# Patient Record
Sex: Female | Born: 1990 | Hispanic: Yes | Smoking: Never smoker
Health system: Southern US, Community
[De-identification: ages and names within clinical notes are randomized; demographics above are authoritative.]

---

## 2020-07-28 ENCOUNTER — Emergency Department
Admission: EM | Admit: 2020-07-28 | Discharge: 2020-07-28 | Disposition: A | Discharge status: Home / Self Care | Payer: Self-pay | Attending: Emergency Medicine | Admitting: Emergency Medicine

## 2020-07-28 ENCOUNTER — Emergency Department: Payer: Self-pay

## 2020-07-28 ENCOUNTER — Other Ambulatory Visit: Payer: Self-pay

## 2020-07-28 ENCOUNTER — Encounter: Payer: Self-pay | Admitting: *Deleted

## 2020-07-28 DIAGNOSIS — M722 Plantar fascial fibromatosis: Secondary | ICD-10-CM | POA: Insufficient documentation

## 2020-07-28 MED ORDER — MELOXICAM 15 MG PO TABS: 15.0000 mg | ORAL_TABLET | Freq: Every day | ORAL | 0 refills | Status: AC

## 2020-07-28 NOTE — ED Provider Notes (Signed)
There is no  [facility] Emergency Department Provider Note   ____________________________________________   Event Date/Time   First MD Initiated Contact with Patient 07/28/20 1519     (approximate)  I have reviewed the triage vital signs and the nursing notes.   HISTORY  Chief Complaint Foot Pain   HPI Kayla Hawkins is a 30 y.o. female presents to the ED with complaint of bilateral foot pain with the left foot being worse than the right.  Patient states this been going on for several weeks and that there was no history of injury.  Patient has been taking Tylenol daily without any relief.  She is also switched shoes which has not helped.  Patient stands most of her working day.  She denies any pain when getting up in the mornings.  She rates her pain as 9 out of 10.       No past medical history on file.  There are no problems to display for this patient.   Prior to Admission medications   Medication Sig Start Date End Date Taking? Authorizing Provider  meloxicam (MOBIC) 15 MG tablet Take 1 tablet (15 mg total) by mouth daily. 07/28/20 07/28/21 Yes Tommi Rumps, PA-C    Allergies Patient has no known allergies.  No family history on file.  Social History Social History   Tobacco Use  . Smoking status: Never Smoker  . Smokeless tobacco: Never Used  Substance Use Topics  . Alcohol use: Not Currently  . Drug use: Not Currently    Review of Systems Constitutional: No fever/chills Eyes: No visual changes. ENT: No sore throat. Cardiovascular: Denies chest pain. Respiratory: Denies shortness of breath. Gastrointestinal: No abdominal pain.  No nausea, no vomiting.  No diarrhea.  Musculoskeletal: Bilateral foot pain Skin: Negative for rash. Neurological: Negative for headaches, focal weakness or numbness.  ____________________________________________   PHYSICAL EXAM:  VITAL SIGNS: ED Triage Vitals  Enc Vitals Group     BP  07/28/20 1512 (!) 112/51     Pulse Rate 07/28/20 1512 90     Resp 07/28/20 1512 18     Temp 07/28/20 1512 (!) 97.5 F (36.4 C)     Temp Source 07/28/20 1512 Oral     SpO2 07/28/20 1512 97 %     Weight 07/28/20 1513 130 lb (59 kg)     Height 07/28/20 1513 4\' 7"  (1.397 m)     Head Circumference --      Peak Flow --      Pain Score 07/28/20 1513 9     Pain Loc --      Pain Edu? --      Excl. in [place]? --     Constitutional: Alert and oriented. Well appearing and in no acute distress. Eyes: Conjunctivae are normal.  Head: Atraumatic. Neck: No stridor.   Cardiovascular: Normal rate, regular rhythm. Grossly normal heart sounds.  Good peripheral circulation. Respiratory: Normal respiratory effort.  No retractions. Lungs CTAB. Musculoskeletal: On examination bilateral gross deformity however there is some minimal soft tissue tenderness noted on the plantar aspect medial area on palpation.  Skin is intact and no discoloration is noted.  Pulses are present.  Motor sensory function intact.  Left foot is more tender than the right. Neurologic:  Normal speech and language. No gross focal neurologic deficits are appreciated. No gait instability. Skin:  Skin is warm, dry and intact. No rash noted. Psychiatric: Mood and affect are normal. Speech and behavior are normal.  ____________________________________________   LABS (all labs ordered are listed, but only abnormal results are displayed)  Labs Reviewed - No data to display ____________________________________________  RADIOLOGY I, Tommi Rumps, personally viewed and evaluated these images (plain radiographs) as part of my medical decision making, as well as reviewing the written report by the radiologist.  Official radiology report(s): DG Foot Complete Left  Result Date: 07/28/2020 CLINICAL DATA:  Pain EXAM: LEFT FOOT - COMPLETE 3+ VIEW COMPARISON:  None. FINDINGS: Frontal, oblique, and lateral views were obtained. There is no fracture  or dislocation. Joint spaces appear normal. No erosive change. IMPRESSION: No fracture or dislocation.  No evident arthropathy. Electronically Signed   By: Bretta Bang III M.D.   On: 07/28/2020 16:09    ____________________________________________   PROCEDURES  Procedure(s) performed (including Critical Care):  Procedures   ____________________________________________   INITIAL IMPRESSION / ASSESSMENT AND PLAN / ED COURSE  As part of my medical decision making, I reviewed the following data within the electronic MEDICAL RECORD NUMBER Notes from prior ED visits and [place] Controlled Substance Database  31 year old female presents to the ED with complaint of bilateral  foot pain without known injury.  Physical exam shows tenderness on palpation of the medial aspect of the plantar portion of the left foot greater than the right.  No other physical findings.  X-rays were negative.  Patient was made aware that this could be plantar fasciitis and she agrees to wear supportive shoes with possible orthotics.  She will start meloxicam 1 daily with food and she is aware that she may take Tylenol if needed for additional pain control.  If not improving she is to follow-up with Dr. Ether Griffins who is on-call for podiatry at no clinic. ____________________________________________   FINAL CLINICAL IMPRESSION(S) / ED DIAGNOSES  Final diagnoses:  Plantar fasciitis, bilateral     ED Discharge Orders         Ordered    meloxicam (MOBIC) 15 MG tablet  Daily        07/28/20 1635          *Please note:  Kayla Hawkins was evaluated in Emergency Department on 07/28/2020 for the symptoms described in the history of present illness. She was evaluated in the context of the global COVID-19 pandemic, which necessitated consideration that the patient might be at risk for infection with the SARS-CoV-2 virus that causes COVID-19. Institutional protocols and algorithms that pertain to the evaluation of patients  at risk for COVID-19 are in a state of rapid change based on information released by regulatory bodies including the CDC and federal and state organizations. These policies and algorithms were followed during the patient's care in the ED.  Some ED evaluations and interventions may be delayed as a result of limited staffing during and the pandemic.*   Note:  This document was prepared using Dragon voice recognition software and may include unintentional dictation errors.    Tommi Rumps, PA-C 07/28/20 1641    Gilles Chiquito, MD 07/28/20 [phone]

## 2020-07-28 NOTE — Discharge Instructions (Signed)
Follow-up with your family or if any continued problems.  Ice to the arch of your foot at night as we discussed.  Begin taking the meloxicam 1 daily with food for inflammation and to help with pain.  You may also continue taking Tylenol with this medication if needed.  Continue to wear shoes with arch support.

## 2020-07-28 NOTE — ED Triage Notes (Signed)
Pt ambulatory to triage.  Pt has bil foot pain   No known injury   Sx for several weeks, pain worse yesterday.  Pt alert speech clear.

## 2021-09-20 IMAGING — DX DG FOOT COMPLETE 3+V*L*
3 series · 3 of 3 positions shown · non-contrast
Comparison: None.

CLINICAL DATA: Pain

EXAM:
LEFT FOOT - COMPLETE 3+ VIEW

[foot ap]
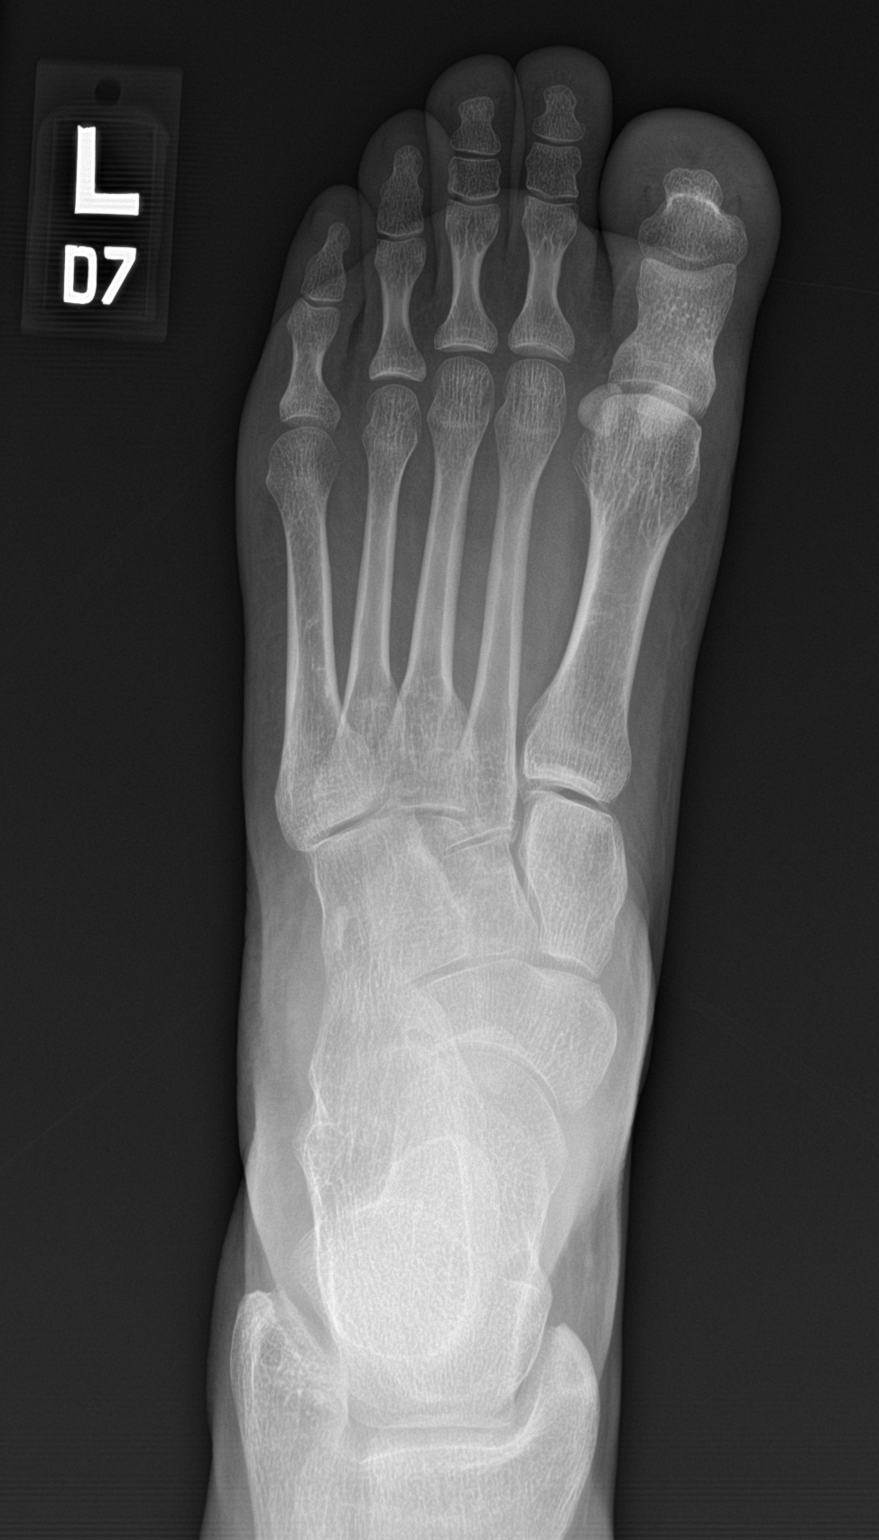

[foot obl]
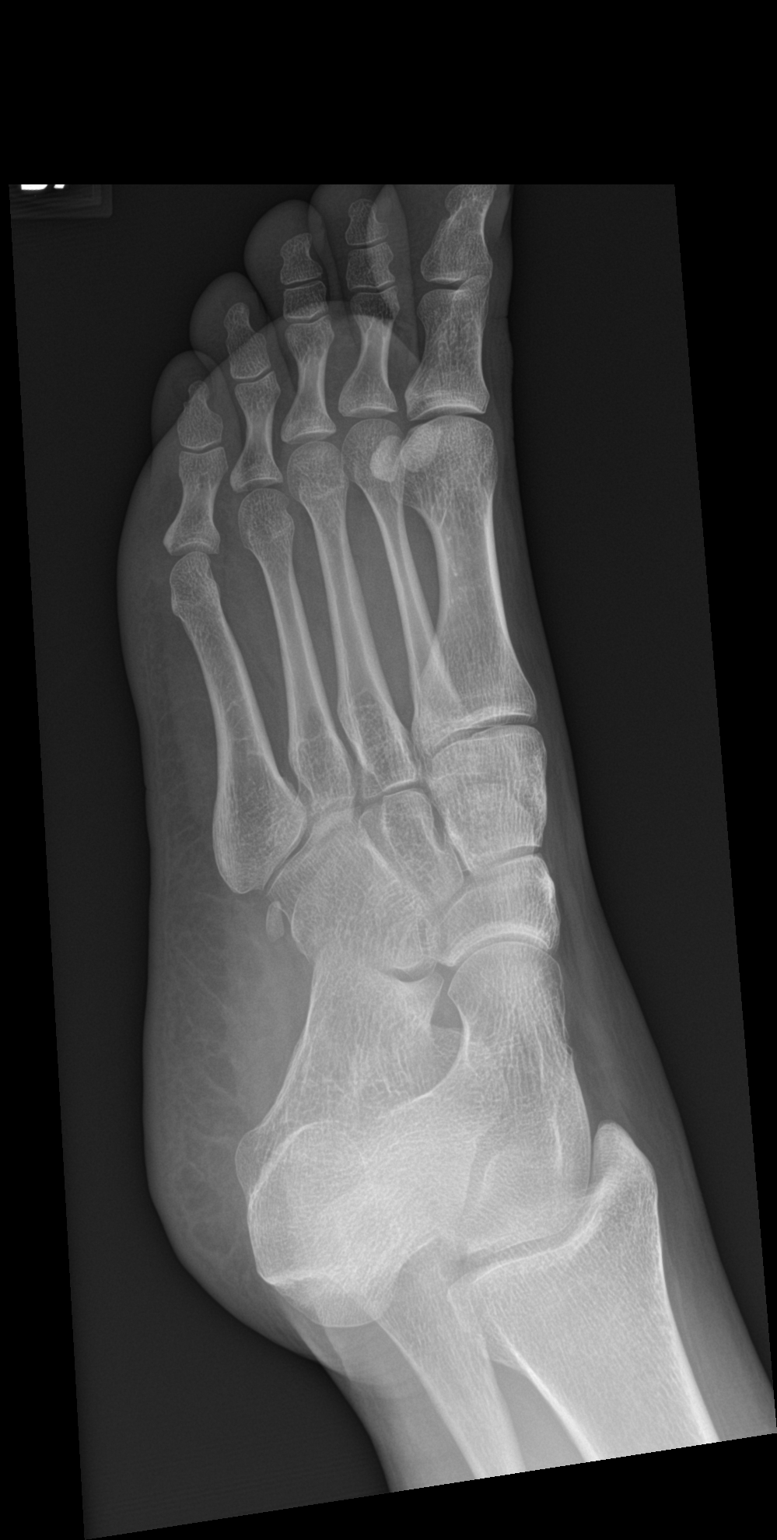

[foot lat]
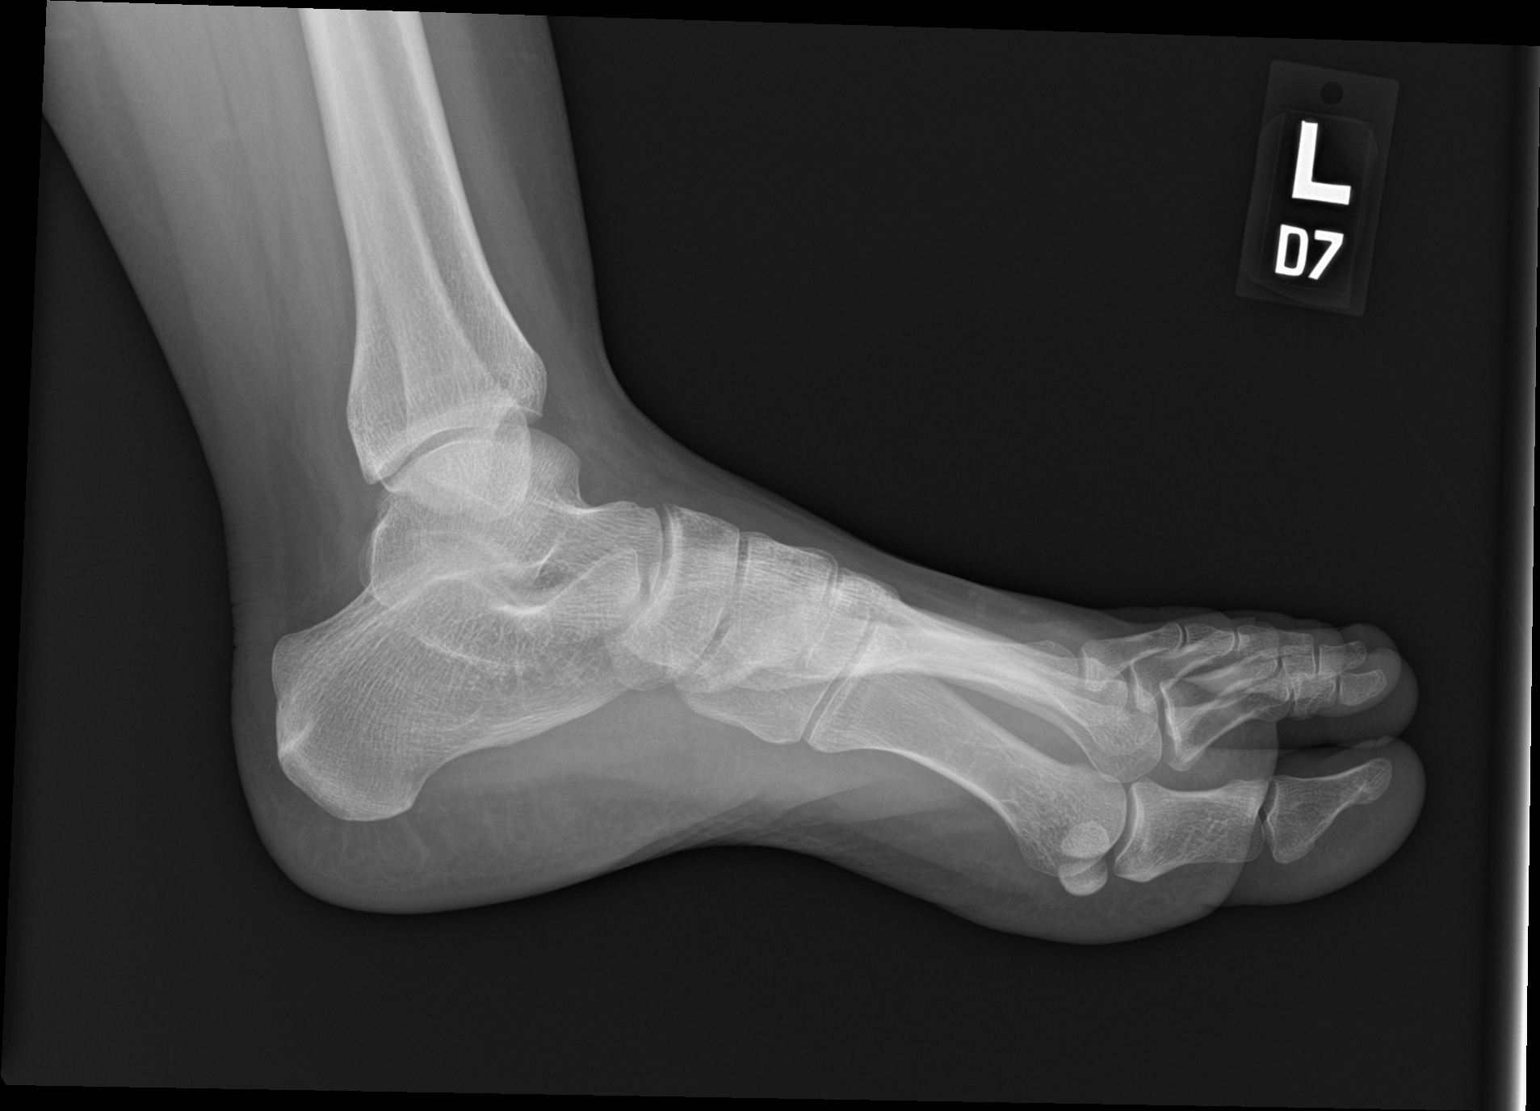

[3 of 3 positions shown; findings below may reference images not displayed]

FINDINGS: Frontal, oblique, and lateral views were obtained. There is no
fracture or dislocation. Joint spaces appear normal. No erosive
change.
IMPRESSION: No fracture or dislocation.  No evident arthropathy.

## 2022-10-10 ENCOUNTER — Other Ambulatory Visit: Payer: Self-pay

## 2022-10-10 ENCOUNTER — Emergency Department
Admission: EM | Admit: 2022-10-10 | Discharge: 2022-10-10 | Disposition: A | Discharge status: Home / Self Care | Payer: Self-pay | Attending: Emergency Medicine | Admitting: Emergency Medicine

## 2022-10-10 ENCOUNTER — Encounter: Payer: Self-pay | Admitting: *Deleted

## 2022-10-10 DIAGNOSIS — H66002 Acute suppurative otitis media without spontaneous rupture of ear drum, left ear: Secondary | ICD-10-CM | POA: Insufficient documentation

## 2022-10-10 MED ORDER — AMOXICILLIN-POT CLAVULANATE 875-125 MG PO TABS
1.0000 | ORAL_TABLET | Freq: Once | ORAL | Status: AC
  Administered 2022-10-10: 1 via ORAL
  Filled 2022-10-10: qty 1

## 2022-10-10 MED ORDER — IBUPROFEN 400 MG PO TABS
400.0000 mg | ORAL_TABLET | Freq: Once | ORAL | Status: AC
  Administered 2022-10-10: 400 mg via ORAL
  Filled 2022-10-10: qty 1

## 2022-10-10 MED ORDER — AMOXICILLIN-POT CLAVULANATE 875-125 MG PO TABS: 1.0000 | ORAL_TABLET | Freq: Two times a day (BID) | ORAL | 0 refills | Status: AC

## 2022-10-10 NOTE — ED Triage Notes (Signed)
Pt has left earache since this am.  No cough.  Pt reports a runny nose  pt alert.

## 2022-10-10 NOTE — ED Provider Notes (Signed)
   [facility] Provider Note    Event Date/Time   First MD Initiated Contact with Patient 10/10/22 1823     (approximate)   History   Otalgia   HPI  Kayla Hawkins is a 32 y.o. female who presents with complaints of 1 day of left ear pain, she reports it is moderate to severe, no discharge.  No other symptoms.  She is not diabetic     Physical Exam   Triage Vital Signs: ED Triage Vitals  Enc Vitals Group     BP 10/10/22 1802 90/71     Pulse Rate 10/10/22 1802 96     Resp 10/10/22 1802 18     Temp 10/10/22 1802 98.8 F (37.1 C)     Temp Source 10/10/22 1802 Oral     SpO2 10/10/22 1802 96 %     Weight 10/10/22 1803 59.4 kg (131 lb)     Height 10/10/22 1803 1.422 m (4\' 8" )     Head Circumference --      Peak Flow --      Pain Score 10/10/22 1802 7     Pain Loc --      Pain Edu? --      Excl. in [place]? --     Most recent vital signs: Vitals:   10/10/22 1802  BP: 90/71  Pulse: 96  Resp: 18  Temp: 98.8 F (37.1 C)  SpO2: 96%     General: Awake, no distress.  CV:  Good peripheral perfusion.  Resp:  Normal effort.  Abd:  No distention.  Other:  Left TM, bulging, erythematous, no rupture noted  ED Results / Procedures / Treatments   Labs (all labs ordered are listed, but only abnormal results are displayed) Labs Reviewed - No data to display   EKG     RADIOLOGY     PROCEDURES:  Critical Care performed:   Procedures   MEDICATIONS ORDERED IN ED: Medications  ibuprofen (ADVIL) tablet 400 mg (has no administration in time range)  amoxicillin-clavulanate (AUGMENTIN) 875-125 MG per tablet 1 tablet (has no administration in time range)     IMPRESSION / MDM / ASSESSMENT AND PLAN / ED COURSE  I reviewed the triage vital signs and the nursing notes. Patient's presentation is most consistent with acute, uncomplicated illness.  Patient presents with isolated left ear pain consistent with otitis media on exam, will start  Augmentin, first dose here.        FINAL CLINICAL IMPRESSION(S) / ED DIAGNOSES   Final diagnoses:  Non-recurrent acute suppurative otitis media of left ear without spontaneous rupture of tympanic membrane     Rx / DC Orders   ED Discharge Orders          Ordered    amoxicillin-clavulanate (AUGMENTIN) 875-125 MG tablet  2 times daily        10/10/22 1843             Note:  This document was prepared using Dragon voice recognition software and may include unintentional dictation errors.   Lavonia Drafts, MD 10/10/22 [phone]
